# Patient Record
Sex: Male | Born: 1975 | Race: Black or African American | Hispanic: No | Marital: Married | Smoking: Current every day smoker
Health system: Southern US, Community
[De-identification: ages and names within clinical notes are randomized; demographics above are authoritative.]

## PROBLEM LIST (undated history)

## (undated) HISTORY — PX: TOOTH EXTRACTION: SUR596

---

## 1998-04-30 ENCOUNTER — Encounter: Admission: RE | Admit: 1998-04-30 | Discharge: 1998-04-30 | Payer: Self-pay | Admitting: *Deleted

## 2002-07-24 ENCOUNTER — Encounter: Admission: RE | Admit: 2002-07-24 | Discharge: 2002-07-24 | Payer: Self-pay | Admitting: Occupational Medicine

## 2002-07-24 ENCOUNTER — Encounter: Payer: Self-pay | Admitting: Occupational Medicine

## 2003-06-05 ENCOUNTER — Ambulatory Visit (HOSPITAL_COMMUNITY): Admission: RE | Admit: 2003-06-05 | Discharge: 2003-06-05 | Payer: Self-pay | Admitting: *Deleted

## 2003-06-05 HISTORY — PX: HERNIA REPAIR: SHX51

## 2011-03-15 ENCOUNTER — Inpatient Hospital Stay (INDEPENDENT_AMBULATORY_CARE_PROVIDER_SITE_OTHER)
Admission: RE | Admit: 2011-03-15 | Discharge: 2011-03-15 | Disposition: A | Discharge status: Home / Self Care | Payer: BC Managed Care – PPO | Source: Ambulatory Visit | Attending: Family Medicine | Admitting: Family Medicine

## 2011-03-15 DIAGNOSIS — J40 Bronchitis, not specified as acute or chronic: Secondary | ICD-10-CM

## 2011-03-15 DIAGNOSIS — H109 Unspecified conjunctivitis: Secondary | ICD-10-CM

## 2011-05-27 ENCOUNTER — Ambulatory Visit (INDEPENDENT_AMBULATORY_CARE_PROVIDER_SITE_OTHER): Payer: BC Managed Care – PPO | Admitting: General Surgery

## 2011-06-03 ENCOUNTER — Ambulatory Visit (INDEPENDENT_AMBULATORY_CARE_PROVIDER_SITE_OTHER): Payer: BC Managed Care – PPO | Admitting: Surgery

## 2011-06-06 ENCOUNTER — Emergency Department (HOSPITAL_COMMUNITY)
Admission: EM | Admit: 2011-06-06 | Discharge: 2011-06-07 | Disposition: A | Discharge status: Home / Self Care | Payer: BC Managed Care – PPO | Attending: Emergency Medicine | Admitting: Emergency Medicine

## 2011-06-06 ENCOUNTER — Emergency Department (HOSPITAL_COMMUNITY): Payer: BC Managed Care – PPO

## 2011-06-06 DIAGNOSIS — R509 Fever, unspecified: Secondary | ICD-10-CM | POA: Insufficient documentation

## 2011-06-06 DIAGNOSIS — J40 Bronchitis, not specified as acute or chronic: Secondary | ICD-10-CM | POA: Insufficient documentation

## 2011-06-06 DIAGNOSIS — R062 Wheezing: Secondary | ICD-10-CM | POA: Insufficient documentation

## 2011-06-06 DIAGNOSIS — J069 Acute upper respiratory infection, unspecified: Secondary | ICD-10-CM | POA: Insufficient documentation

## 2011-06-06 DIAGNOSIS — R0602 Shortness of breath: Secondary | ICD-10-CM | POA: Insufficient documentation

## 2011-06-14 ENCOUNTER — Encounter (INDEPENDENT_AMBULATORY_CARE_PROVIDER_SITE_OTHER): Payer: Self-pay | Admitting: Surgery

## 2011-06-15 ENCOUNTER — Encounter (INDEPENDENT_AMBULATORY_CARE_PROVIDER_SITE_OTHER): Payer: Self-pay | Admitting: Surgery

## 2011-06-15 ENCOUNTER — Ambulatory Visit (INDEPENDENT_AMBULATORY_CARE_PROVIDER_SITE_OTHER): Payer: BC Managed Care – PPO | Admitting: Surgery

## 2011-06-15 VITALS — BP 118/90 | HR 80 | Temp 98.3°F | Ht 73.0 in | Wt 212.6 lb

## 2011-06-15 DIAGNOSIS — K4090 Unilateral inguinal hernia, without obstruction or gangrene, not specified as recurrent: Secondary | ICD-10-CM

## 2011-06-15 NOTE — Patient Instructions (Signed)
We will schedule your surgery today. 

## 2011-06-15 NOTE — Progress Notes (Signed)
Chief Complaint  Patient presents with  . Inguinal Hernia    HPI Dan Shepherd is a 35 y.o. male who is eight years s/p open left inguinal hernia repair with mesh.  His job involves a lot of strenuous activity and lifting.  Over the last several months, he has noted an enlarging bulge in his right groin, which has caused some discomfort.  He is still able to work for now.  He reports some abdominal discomfort and distention, but continues to have daily bowel movements.  The hernia reduces when he is supine.  He feels that his right testicle is larger than the left. HPI  History reviewed. No pertinent past medical history.  Past Surgical History  Procedure Date  . Hernia repair 06/05/2003    lt inguinal hernia  . Tooth extraction     History reviewed. No pertinent family history.  Social History History  Substance Use Topics  . Smoking status: Current Everyday Smoker    Types: Cigars  . Smokeless tobacco: Not on file  . Alcohol Use: 1.2 oz/week    2 Cans of beer per week    No Known Allergies  No current outpatient prescriptions on file.    Review of Systems Review of Systems ROS reviewed with patient and is essentially negative. Blood pressure 118/90, pulse 80, temperature 98.3 F (36.8 C), temperature source Temporal, height 6\' 1"  (1.854 m), weight 212 lb 9.6 oz (96.435 kg).  Physical Exam Physical Exam WDWN in NAD HEENT:  EOMI, sclera anicteric Neck:  No masses, no thyromegaly Lungs:  CTA bilaterally; normal respiratory effort CV:  Regular rate and rhythm; no murmurs Abd:  +bowel sounds, soft, non-tender, no masses GU:  Bilateral descended testicles with slight enlargement of right compared to left.  No abnormal masses noted.  Large reducible right inguinal hernia with obvious small bowel involvement.  No sign of recurrent left inguinal hernia Ext:  Well-perfused; no edema Skin:  Warm, dry; no sign of jaundice  Data Reviewed None  Assessment    Reducible  right inguinal hernia    Plan    Right inguinal hernia repair with mesh.   I described the procedure in detail.  The patient was given educational material. We discussed the risks and benefits including but not limited to bleeding, infection, chronic inguinal pain, nerve entrapment, hernia recurrence, mesh complications, hematoma formation, urinary retention, injury to the testicles, numbness in the groin, blood clots, injury to the surrounding structures, and anesthesia risk. We also discussed the typical post operative recovery course, including no heavy lifting for 6 weeks.        Dan Shepherd K. 06/15/2011, 11:49 AM

## 2011-07-30 DIAGNOSIS — K4090 Unilateral inguinal hernia, without obstruction or gangrene, not specified as recurrent: Secondary | ICD-10-CM

## 2011-07-30 HISTORY — PX: HERNIA REPAIR: SHX51

## 2011-08-03 ENCOUNTER — Encounter (INDEPENDENT_AMBULATORY_CARE_PROVIDER_SITE_OTHER): Payer: Self-pay | Admitting: Surgery

## 2011-08-12 ENCOUNTER — Ambulatory Visit (INDEPENDENT_AMBULATORY_CARE_PROVIDER_SITE_OTHER): Payer: BC Managed Care – PPO | Admitting: Surgery

## 2011-08-12 ENCOUNTER — Encounter (INDEPENDENT_AMBULATORY_CARE_PROVIDER_SITE_OTHER): Payer: Self-pay | Admitting: Surgery

## 2011-08-12 VITALS — BP 146/100 | HR 76 | Temp 98.2°F | Resp 16 | Ht 72.0 in | Wt 220.4 lb

## 2011-08-12 DIAGNOSIS — K4090 Unilateral inguinal hernia, without obstruction or gangrene, not specified as recurrent: Secondary | ICD-10-CM

## 2011-08-12 NOTE — Patient Instructions (Signed)
Resume full activity as instructed.

## 2011-08-12 NOTE — Progress Notes (Signed)
Status post repair of right inguinal hernia with mesh on July 30, 2011 for a indirect hernia. The hernia was fairly large. It was repaired with ultra Pro. The patient is doing quite well. Minimal swelling. No sign of infection or recurrence. His job requires a lot of heavy lifting and strenuous activity so we will keep him out of work for a full 6 weeks. Follow up p.r.n.

## 2011-09-14 ENCOUNTER — Encounter (INDEPENDENT_AMBULATORY_CARE_PROVIDER_SITE_OTHER): Payer: Self-pay | Admitting: General Surgery

## 2017-02-20 ENCOUNTER — Encounter (HOSPITAL_COMMUNITY): Payer: Self-pay

## 2017-02-20 ENCOUNTER — Emergency Department (HOSPITAL_COMMUNITY): Payer: Self-pay

## 2017-02-20 ENCOUNTER — Emergency Department (HOSPITAL_COMMUNITY)
Admission: EM | Admit: 2017-02-20 | Discharge: 2017-02-20 | Disposition: A | Discharge status: Home / Self Care | Payer: Self-pay | Attending: Dermatology | Admitting: Dermatology

## 2017-02-20 DIAGNOSIS — R071 Chest pain on breathing: Secondary | ICD-10-CM | POA: Insufficient documentation

## 2017-02-20 DIAGNOSIS — M79661 Pain in right lower leg: Secondary | ICD-10-CM | POA: Insufficient documentation

## 2017-02-20 LAB — BASIC METABOLIC PANEL
ANION GAP: 8 (ref 5–15)
BUN: <5 mg/dL — ABNORMAL LOW (ref 6–20)
CO2: 28 mmol/L (ref 22–32)
Calcium: 8.7 mg/dL — ABNORMAL LOW (ref 8.9–10.3)
Chloride: 102 mmol/L (ref 101–111)
Creatinine, Ser: 0.95 mg/dL (ref 0.61–1.24)
GLUCOSE: 121 mg/dL — AB (ref 65–99)
POTASSIUM: 3.7 mmol/L (ref 3.5–5.1)
Sodium: 138 mmol/L (ref 135–145)

## 2017-02-20 LAB — CBC
HEMATOCRIT: 41.1 % (ref 39.0–52.0)
HEMOGLOBIN: 15.4 g/dL (ref 13.0–17.0)
MCH: 32 pg (ref 26.0–34.0)
MCHC: 37.5 g/dL — ABNORMAL HIGH (ref 30.0–36.0)
MCV: 85.3 fL (ref 78.0–100.0)
Platelets: 181 10*3/uL (ref 150–400)
RBC: 4.82 MIL/uL (ref 4.22–5.81)
RDW: 14.5 % (ref 11.5–15.5)
WBC: 9.9 10*3/uL (ref 4.0–10.5)

## 2017-02-20 LAB — POCT I-STAT TROPONIN I: TROPONIN I, POC: 0 ng/mL (ref 0.00–0.08)

## 2017-02-20 NOTE — ED Triage Notes (Signed)
Pt c/o chest pain for past 2 days. Moving and deep breathing cause a sharp pain. Pt also c/o right calf pain which occurred after chest pain. Pt has not had a BM in 2 days.

## 2017-02-20 NOTE — ED Notes (Signed)
Pt left before being seen

## 2018-04-13 ENCOUNTER — Emergency Department (HOSPITAL_COMMUNITY)
Admission: EM | Admit: 2018-04-13 | Discharge: 2018-04-13 | Disposition: A | Discharge status: Home / Self Care | Payer: BLUE CROSS/BLUE SHIELD | Attending: Emergency Medicine | Admitting: Emergency Medicine

## 2018-04-13 ENCOUNTER — Encounter (HOSPITAL_COMMUNITY): Payer: Self-pay

## 2018-04-13 ENCOUNTER — Emergency Department (HOSPITAL_COMMUNITY): Payer: BLUE CROSS/BLUE SHIELD

## 2018-04-13 ENCOUNTER — Other Ambulatory Visit: Payer: Self-pay

## 2018-04-13 DIAGNOSIS — F17290 Nicotine dependence, other tobacco product, uncomplicated: Secondary | ICD-10-CM | POA: Diagnosis not present

## 2018-04-13 DIAGNOSIS — E279 Disorder of adrenal gland, unspecified: Secondary | ICD-10-CM | POA: Diagnosis not present

## 2018-04-13 DIAGNOSIS — E278 Other specified disorders of adrenal gland: Secondary | ICD-10-CM

## 2018-04-13 DIAGNOSIS — K921 Melena: Secondary | ICD-10-CM | POA: Insufficient documentation

## 2018-04-13 DIAGNOSIS — Z79899 Other long term (current) drug therapy: Secondary | ICD-10-CM | POA: Insufficient documentation

## 2018-04-13 LAB — CBC WITH DIFFERENTIAL/PLATELET
BASOS ABS: 0.1 10*3/uL (ref 0.0–0.1)
BASOS PCT: 1 %
EOS ABS: 0.2 10*3/uL (ref 0.0–0.7)
Eosinophils Relative: 4 %
HEMATOCRIT: 41.8 % (ref 39.0–52.0)
HEMOGLOBIN: 15.4 g/dL (ref 13.0–17.0)
Lymphocytes Relative: 54 %
Lymphs Abs: 2.8 10*3/uL (ref 0.7–4.0)
MCH: 31.1 pg (ref 26.0–34.0)
MCHC: 36.8 g/dL — ABNORMAL HIGH (ref 30.0–36.0)
MCV: 84.4 fL (ref 78.0–100.0)
MONOS PCT: 5 %
Monocytes Absolute: 0.3 10*3/uL (ref 0.1–1.0)
NEUTROS ABS: 1.9 10*3/uL (ref 1.7–7.7)
NEUTROS PCT: 36 %
Platelets: 208 10*3/uL (ref 150–400)
RBC: 4.95 MIL/uL (ref 4.22–5.81)
RDW: 13.8 % (ref 11.5–15.5)
WBC: 5.1 10*3/uL (ref 4.0–10.5)

## 2018-04-13 LAB — COMPREHENSIVE METABOLIC PANEL
ALBUMIN: 3.8 g/dL (ref 3.5–5.0)
ALK PHOS: 65 U/L (ref 38–126)
ALT: 30 U/L (ref 0–44)
ANION GAP: 7 (ref 5–15)
AST: 25 U/L (ref 15–41)
BILIRUBIN TOTAL: 0.5 mg/dL (ref 0.3–1.2)
BUN: 14 mg/dL (ref 6–20)
CALCIUM: 8.8 mg/dL — AB (ref 8.9–10.3)
CO2: 26 mmol/L (ref 22–32)
Chloride: 109 mmol/L (ref 98–111)
Creatinine, Ser: 0.95 mg/dL (ref 0.61–1.24)
GFR calc Af Amer: >60 mL/min (ref >60)
GFR calc non Af Amer: >60 mL/min (ref >60)
GLUCOSE: 124 mg/dL — AB (ref 70–99)
Potassium: 3.8 mmol/L (ref 3.5–5.1)
SODIUM: 142 mmol/L (ref 135–145)
TOTAL PROTEIN: 7 g/dL (ref 6.5–8.1)

## 2018-04-13 LAB — POC OCCULT BLOOD, ED: Fecal Occult Bld: NEGATIVE

## 2018-04-13 MED ORDER — IOPAMIDOL (ISOVUE-300) INJECTION 61%
INTRAVENOUS | Status: AC
  Filled 2018-04-13: qty 100

## 2018-04-13 MED ORDER — IOPAMIDOL (ISOVUE-300) INJECTION 61%
100.0000 mL | Freq: Once | INTRAVENOUS | Status: AC | PRN
  Administered 2018-04-13: 100 mL via INTRAVENOUS

## 2018-04-13 NOTE — ED Triage Notes (Signed)
Pt presents to Ed from home with blood in stool x3 days. Pt denies ABD pain, but reports blood. No other symptoms.

## 2018-04-13 NOTE — Discharge Instructions (Addendum)
You have a small mass on your left adrenal gland likely benign.  However you should get a repeat CT scan in 6 months to reassess.  Follow-up with gastroenterologist.  Also recommend a primary care relationship.  Phone number given for gastroenterology.

## 2018-04-13 NOTE — ED Provider Notes (Signed)
[facility]-EMERGENCY DEPT Provider Note   CSN: [id number] Arrival date & time: 04/13/18  0515     History   Chief Complaint Chief Complaint  Patient presents with  . Blood In Stools    HPI Dan Shepherd is a 42 y.o. male.  Patient reports blood in his stool for 1 month, getting worse past 3 days.  No syncope, nausea, vomiting, diarrhea, abdominal pain, prior history of cancer.  Severity of symptoms is moderate.  Nothing makes symptoms better or worse.     Past Medical History:  Diagnosis Date  . Inguinal hernia    RIH    Patient Active Problem List   Diagnosis Date Noted  . Right inguinal hernia 06/15/2011    Past Surgical History:  Procedure Laterality Date  . HERNIA REPAIR  06/05/2003   lt inguinal hernia  . HERNIA REPAIR  07/30/11   RIH  . TOOTH EXTRACTION          Home Medications    Prior to Admission medications   Medication Sig Start Date End Date Taking? Authorizing Provider  acetaminophen (TYLENOL) 500 MG tablet Take 1,000 mg by mouth every 6 (six) hours as needed for mild pain.   Yes [provider]  Multiple Vitamin (MULTIVITAMIN WITH MINERALS) TABS tablet Take 1 tablet by mouth daily.   Yes [provider]    Family History History reviewed. No pertinent family history.  Social History Social History   Tobacco Use  . Smoking status: Current Every Day Smoker    Types: Cigars  . Smokeless tobacco: Never Used  Substance Use Topics  . Alcohol use: Yes    Alcohol/week: 1.2 oz    Types: 2 Cans of beer per week  . Drug use: No     Allergies   Patient has no known allergies.   Review of Systems Review of Systems  All other systems reviewed and are negative.    Physical Exam Updated Vital Signs BP (!) 144/95 (BP Location: Right Arm)   Pulse 76   Temp 98.6 F (37 C) (Oral)   Resp 18   Ht 6' (1.829 m)   Wt 108.9 kg (240 lb)   SpO2 97%   BMI 32.55 kg/m   Physical Exam  Constitutional:  He is oriented to person, place, and time. He appears well-developed and well-nourished.  nad  HENT:  Head: Normocephalic and atraumatic.  Eyes: Conjunctivae are normal.  Neck: Neck supple.  Cardiovascular: Normal rate and regular rhythm.  Pulmonary/Chest: Effort normal and breath sounds normal.  Abdominal: Soft. Bowel sounds are normal.  Musculoskeletal: Normal range of motion.  Neurological: He is alert and oriented to person, place, and time.  Skin: Skin is warm and dry.  Psychiatric: He has a normal mood and affect. His behavior is normal.  Nursing note and vitals reviewed.    ED Treatments / Results  Labs (all labs ordered are listed, but only abnormal results are displayed) Labs Reviewed  CBC WITH DIFFERENTIAL/PLATELET - Abnormal; Notable for the following components:      Result Value   MCHC 36.8 (*)    All other components within normal limits  COMPREHENSIVE METABOLIC PANEL - Abnormal; Notable for the following components:   Glucose, Bld 124 (*)    Calcium 8.8 (*)    All other components within normal limits  POC OCCULT BLOOD, ED    EKG None  Radiology Ct Abdomen Pelvis W Contrast  Result Date: 04/13/2018 CLINICAL DATA:  Blood in  stool for 3 days. EXAM: CT ABDOMEN AND PELVIS WITH CONTRAST TECHNIQUE: Multidetector CT imaging of the abdomen and pelvis was performed using the standard protocol following bolus administration of intravenous contrast. CONTRAST:  100mL ISOVUE-300 IOPAMIDOL (ISOVUE-300) INJECTION 61% COMPARISON:  None. FINDINGS: Lower chest: The lung bases are clear of acute process. No pleural effusion or pulmonary lesions. The heart is normal in size. No pericardial effusion. The distal esophagus and aorta are unremarkable. Hepatobiliary: No focal hepatic lesions or intrahepatic biliary dilatation. The gallbladder is normal. No common bile duct dilatation. Pancreas: No mass, inflammation or ductal dilatation. Spleen: Normal size.  No focal lesions.  Adrenals/Urinary Tract: 13 mm left adrenal gland nodule involving the medial limb, this is likely a benign adenoma. I would recommend a follow-up noncontrast abdominal CT scan in 6 months to document stability. The right adrenal gland is normal. Both kidneys are normal. No renal, ureteral or bladder calculi or mass. Stomach/Bowel: The stomach, duodenum, small bowel and colon are unremarkable. No acute inflammatory changes, mass lesions or obstructive findings. Scattered colonic diverticulosis but no findings for acute diverticulitis. The terminal ileum is normal. The appendix is normal. Vascular/Lymphatic: The aorta is normal in caliber. No dissection. The branch vessels are patent. The major venous structures are patent. No mesenteric or retroperitoneal mass or adenopathy. Small scattered lymph nodes are noted. Reproductive: The prostate gland and seminal vesicles are unremarkable. Other: No pelvic mass or adenopathy. No free pelvic fluid collections. No inguinal mass or adenopathy. No abdominal wall hernia or subcutaneous lesions. Musculoskeletal: No significant bony findings. IMPRESSION: 1. No CT findings to account for the patient's hematochezia. No colonic mass or findings for acute diverticulitis or other inflammatory bowel process. 2. No acute abdominal/pelvic findings, mass lesions or adenopathy. 3. 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 Recommend followup noncontrast abdominal CT scan in 6 months to reassess. Electronically Signed   By: Rudie MeyerP.  Gallerani M.D.   On: 04/13/2018 10:39    Procedures Procedures (including critical care time)  Medications Ordered in ED Medications  iopamidol (ISOVUE-300) 61 % injection (has no administration in time range)  iopamidol (ISOVUE-300) 61 % injection 100 mL (100 mLs Intravenous Contrast Given 04/13/18 1000)     Initial Impression / Assessment and Plan / ED Course  I have reviewed the triage vital signs and the nursing  notes.  Pertinent labs & imaging results that were available during my care of the patient were reviewed by me and considered in my medical decision making (see chart for details).     Patient presents with persistent rectal bleeding.  Hemoglobin stable.  Stool for occult blood negative.  CT scan reveals no intestinal pathology; however, there is a 13 mm left adrenal nodule noted.  This was discussed with the patient and his wife.  Will obtain GI follow-up.  Final Clinical Impressions(s) / ED Diagnoses   Final diagnoses:  Blood in stool  Left adrenal mass [facility](HCC)    ED Discharge Orders    None       Donnetta Hutchingook, Brynd[place], MD 04/13/18 [id number]

## 2020-05-26 IMAGING — CT CT ABD-PELV W/ CM
2 of 5 series · 16 of 46 positions shown, 18 images · IV contrast (ISOVUE)
Comparison: None.

CLINICAL DATA: Blood in stool for 3 days.

EXAM:
CT ABDOMEN AND PELVIS WITH CONTRAST
TECHNIQUE: Multidetector CT imaging of the abdomen and pelvis was performed
using the standard protocol following bolus administration of
intravenous contrast.
CONTRAST:  100mL KZ32KE-A66 IOPAMIDOL (KZ32KE-A66) INJECTION 61%

[Series 2: axial st · axial · 0.88mm/px · z∈[-374,+51]mm · 13 of 99 slices shown, 15 images]
[im 7/99  soft-tissue]
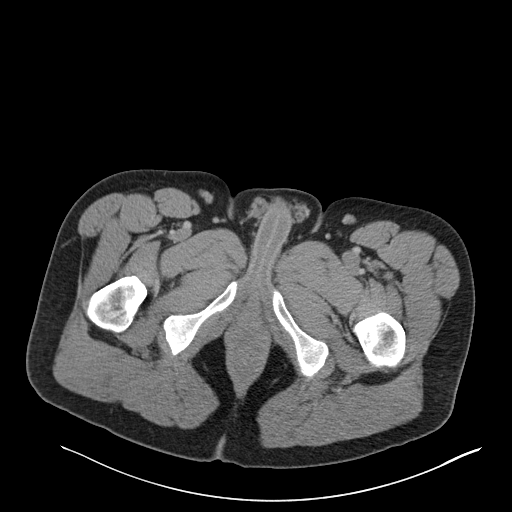
[im 7/99  bone]
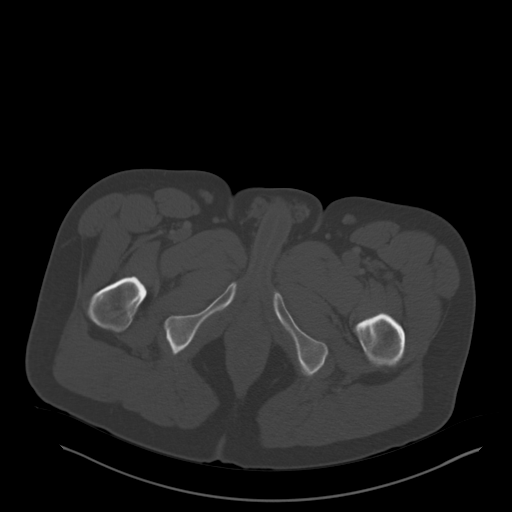
[im 14/99  soft-tissue]
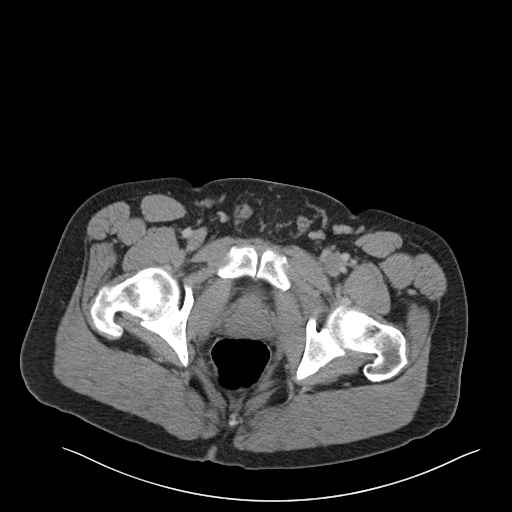
[im 20/99  soft-tissue]
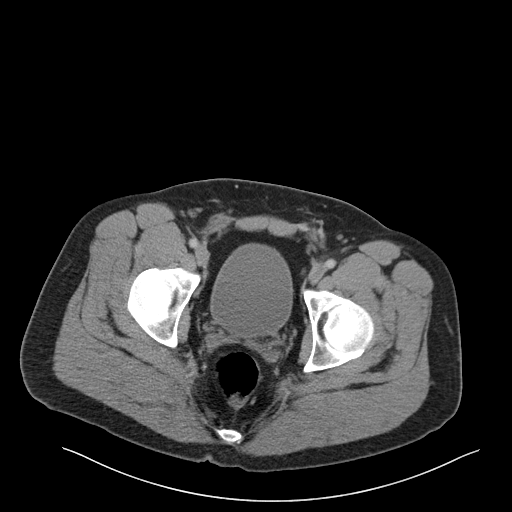
[im 27/99  soft-tissue]
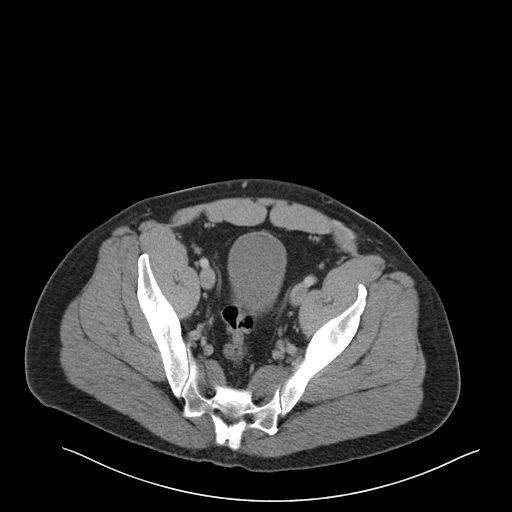
[im 33/99  soft-tissue]
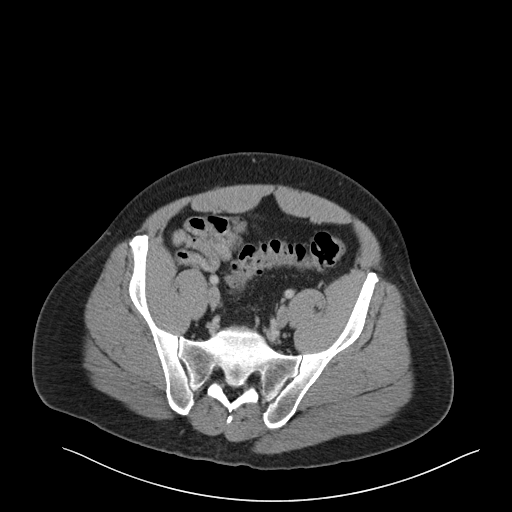
[im 40/99  soft-tissue]
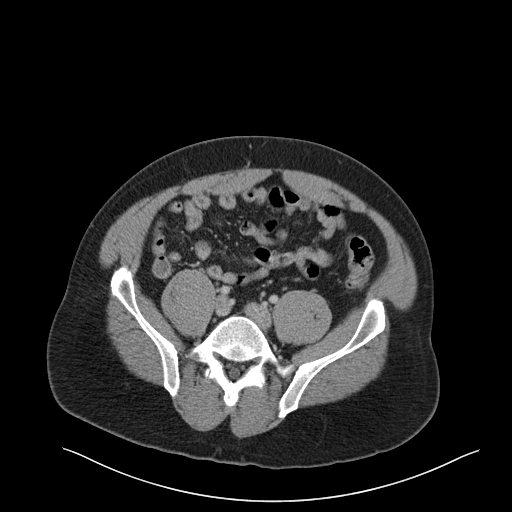
[im 53/99  soft-tissue]
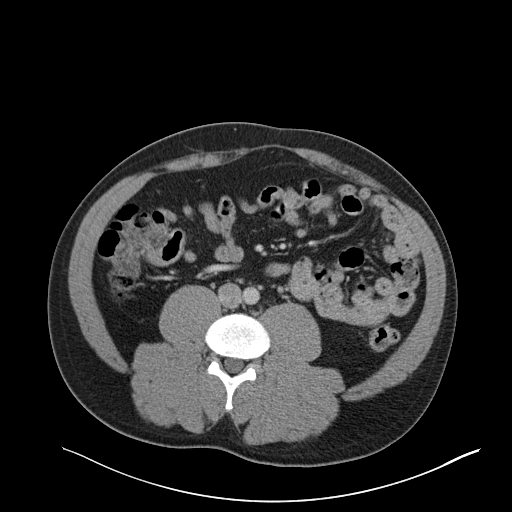
[im 59/99  soft-tissue]
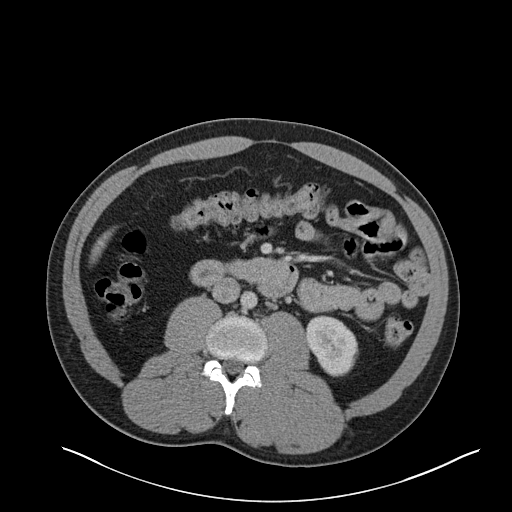
[im 66/99  soft-tissue]
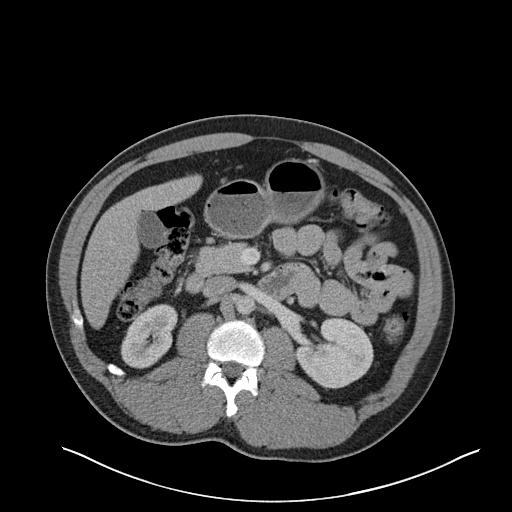
[im 66/99  bone]
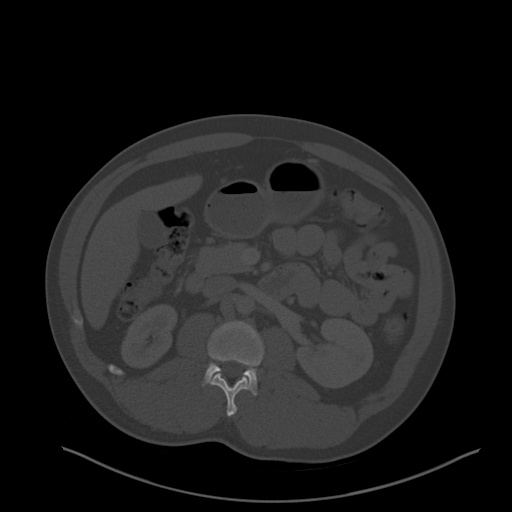
[im 72/99  soft-tissue]
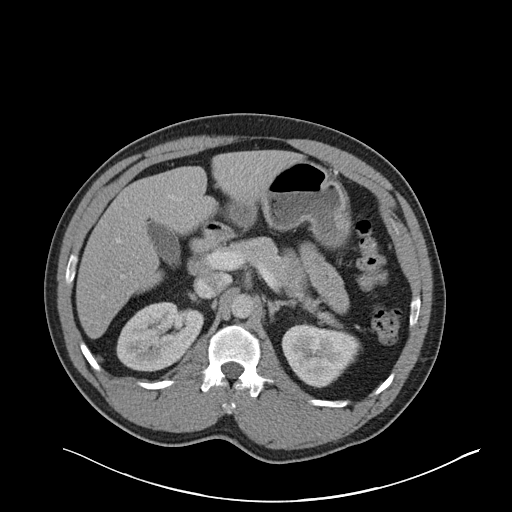
[im 79/99  soft-tissue]
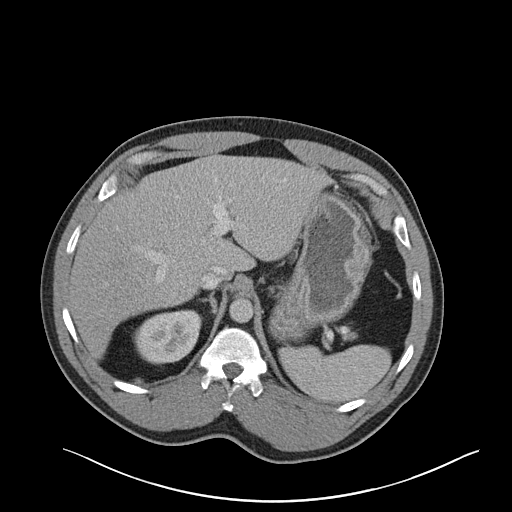
[im 85/99  soft-tissue]
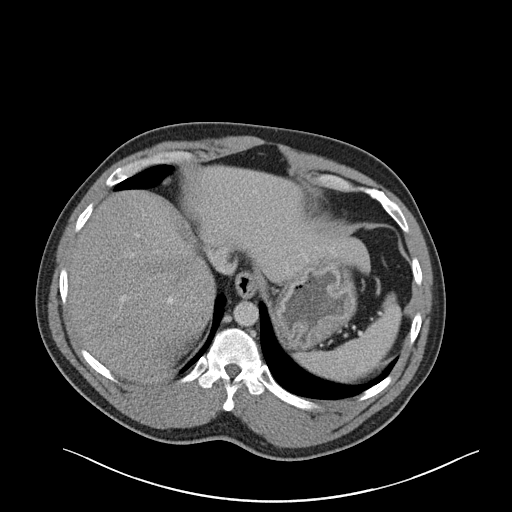
[im 92/99  soft-tissue]
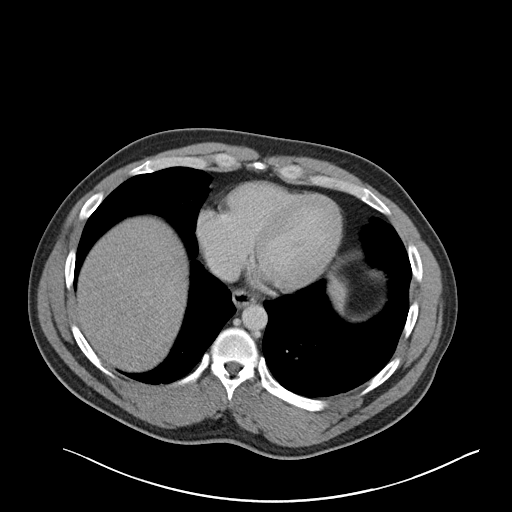

[Series 4: coronal st · coronal · 0.94mm/px · 3 of 91 slices shown]
[im 31/91  soft-tissue]
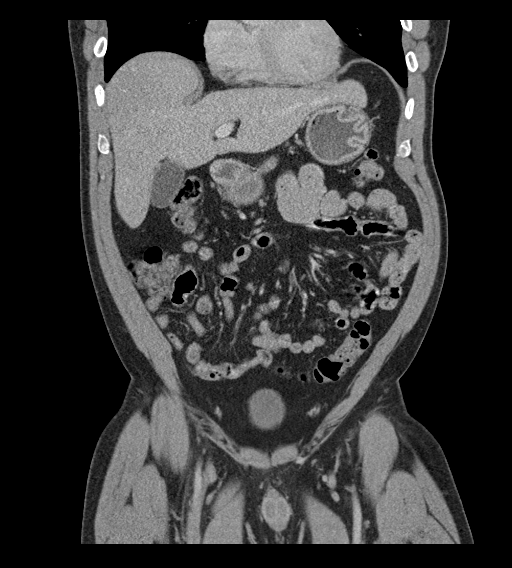
[im 41/91  soft-tissue]
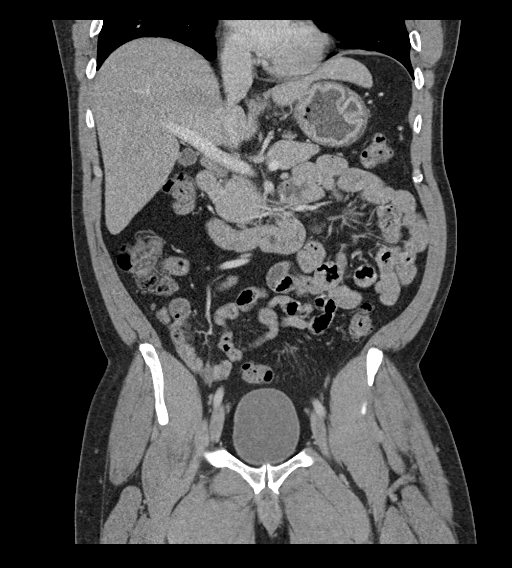
[im 51/91  soft-tissue]
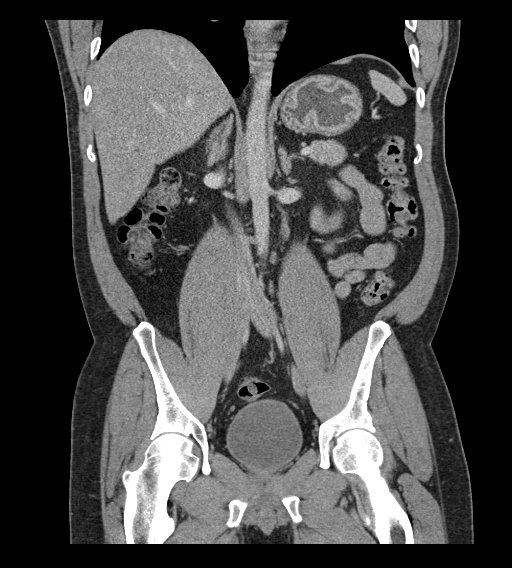

[16 of 46 positions shown; findings below may reference images not displayed]

FINDINGS: Lower chest: The lung bases are clear of acute process. No pleural
effusion or pulmonary lesions. The heart is normal in size. No
pericardial effusion. The distal esophagus and aorta are
unremarkable.

Hepatobiliary: No focal hepatic lesions or intrahepatic biliary
dilatation. The gallbladder is normal. No common bile duct
dilatation.

Pancreas: No mass, inflammation or ductal dilatation.

Spleen: Normal size.  No focal lesions.

Adrenals/Urinary Tract: 13 mm left adrenal gland nodule involving
the medial limb, this is likely a benign adenoma. I would recommend
a follow-up noncontrast abdominal CT scan in 6 months to document
stability. The right adrenal gland is normal.

Both kidneys are normal. No renal, ureteral or bladder calculi or
mass.

Stomach/Bowel: The stomach, duodenum, small bowel and colon are
unremarkable. No acute inflammatory changes, mass lesions or
obstructive findings. Scattered colonic diverticulosis but no
findings for acute diverticulitis. The terminal ileum is normal. The
appendix is normal.

Vascular/Lymphatic: The aorta is normal in caliber. No dissection.
The branch vessels are patent. The major venous structures are
patent. No mesenteric or retroperitoneal mass or adenopathy. Small
scattered lymph nodes are noted.

Reproductive: The prostate gland and seminal vesicles are
unremarkable.

Other: No pelvic mass or adenopathy. No free pelvic fluid
collections. No inguinal mass or adenopathy. No abdominal wall
hernia or subcutaneous lesions.

Musculoskeletal: No significant bony findings.
IMPRESSION: 1. No CT findings to account for the patient's hematochezia. No
colonic mass or findings for acute diverticulitis or other
inflammatory bowel process.
2. No acute abdominal/pelvic findings, mass lesions or adenopathy.
3. 13 mm left adrenal gland nodule is most likely a benign adenoma.
Recommend followup noncontrast abdominal CT scan in 6 months to
reassess.

## 2021-12-16 ENCOUNTER — Emergency Department (HOSPITAL_BASED_OUTPATIENT_CLINIC_OR_DEPARTMENT_OTHER)
Admission: EM | Admit: 2021-12-16 | Discharge: 2021-12-16 | Disposition: A | Discharge status: Home / Self Care | Payer: 59 | Attending: Emergency Medicine | Admitting: Emergency Medicine

## 2021-12-16 ENCOUNTER — Other Ambulatory Visit: Payer: Self-pay

## 2021-12-16 ENCOUNTER — Encounter (HOSPITAL_BASED_OUTPATIENT_CLINIC_OR_DEPARTMENT_OTHER): Payer: Self-pay

## 2021-12-16 DIAGNOSIS — Y92410 Unspecified street and highway as the place of occurrence of the external cause: Secondary | ICD-10-CM | POA: Diagnosis not present

## 2021-12-16 DIAGNOSIS — M542 Cervicalgia: Secondary | ICD-10-CM | POA: Insufficient documentation

## 2021-12-16 DIAGNOSIS — R0789 Other chest pain: Secondary | ICD-10-CM | POA: Diagnosis not present

## 2021-12-16 DIAGNOSIS — R079 Chest pain, unspecified: Secondary | ICD-10-CM
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
